# Patient Record
Sex: Male | Born: 1980 | Race: White | Hispanic: No | Marital: Single | Smoking: Current every day smoker
Health system: Southern US, Community
[De-identification: ages and names within clinical notes are randomized; demographics above are authoritative.]

## PROBLEM LIST (undated history)

## (undated) DIAGNOSIS — F419 Anxiety disorder, unspecified: Secondary | ICD-10-CM

## (undated) HISTORY — DX: Anxiety disorder, unspecified: F41.9

---

## 2013-02-20 ENCOUNTER — Emergency Department: Payer: Self-pay | Admitting: Unknown Physician Specialty

## 2013-02-20 LAB — CBC
HCT: 46.9 % (ref 40.0–52.0)
HGB: 15.8 g/dL (ref 13.0–18.0)
MCH: 31.6 pg (ref 26.0–34.0)
MCHC: 33.8 g/dL (ref 32.0–36.0)
MCV: 93 fL (ref 80–100)
RBC: 5.01 10*6/uL (ref 4.40–5.90)
WBC: 12.8 10*3/uL — ABNORMAL HIGH (ref 3.8–10.6)

## 2013-02-20 LAB — COMPREHENSIVE METABOLIC PANEL
Albumin: 4.1 g/dL (ref 3.4–5.0)
Alkaline Phosphatase: 112 U/L (ref 50–136)
BUN: 12 mg/dL (ref 7–18)
Bilirubin,Total: 0.3 mg/dL (ref 0.2–1.0)
Calcium, Total: 9.1 mg/dL (ref 8.5–10.1)
Co2: 29 mmol/L (ref 21–32)
EGFR (African American): >60
Osmolality: 269 (ref 275–301)
Potassium: 4.4 mmol/L (ref 3.5–5.1)
SGOT(AST): 40 U/L — ABNORMAL HIGH (ref 15–37)
SGPT (ALT): 64 U/L (ref 12–78)
Sodium: 135 mmol/L — ABNORMAL LOW (ref 136–145)

## 2015-02-05 ENCOUNTER — Ambulatory Visit (INDEPENDENT_AMBULATORY_CARE_PROVIDER_SITE_OTHER): Payer: BLUE CROSS/BLUE SHIELD | Admitting: Internal Medicine

## 2015-02-05 VITALS — BP 106/70 | HR 110 | Temp 97.8°F | Resp 19 | Ht 70.0 in | Wt 179.6 lb

## 2015-02-05 DIAGNOSIS — Z720 Tobacco use: Secondary | ICD-10-CM

## 2015-02-05 DIAGNOSIS — J0100 Acute maxillary sinusitis, unspecified: Secondary | ICD-10-CM

## 2015-02-05 DIAGNOSIS — G4700 Insomnia, unspecified: Secondary | ICD-10-CM | POA: Insufficient documentation

## 2015-02-05 DIAGNOSIS — F17200 Nicotine dependence, unspecified, uncomplicated: Secondary | ICD-10-CM | POA: Insufficient documentation

## 2015-02-05 MED ORDER — AMOXICILLIN 875 MG PO TABS: 875.0000 mg | ORAL_TABLET | Freq: Two times a day (BID) | ORAL | Status: DC

## 2015-02-05 NOTE — Progress Notes (Signed)
   Subjective:    Patient ID: Earney NavyPaul A Strawderman, male    DOB: Nov 21, 1981, 34 y.o.   MRN: [id number] This chart was scribed for Ellamae Siaobert Shan Padgett, MD by Jolene Provostobert Halas, Medical Scribe. This patient was seen in Room 1 and the patient's care was started a 12:25 PM.  Chief Complaint  Patient presents with  . Sore Throat    x 1 week  . Nasal Congestion    x 1 week  . Chest congestion    x 1 week  . Cough    Productive, 1 week  . Fatigue    x 1 week    HPI HPI Comments: Earney Navyaul A Bradham is a 34 y.o. male who [facility] complaining of sore throat with associated fatigue and congestion. Pt denies fever, chills, and sleep disturbance. Pt states he takes a prescription sleep aid for sleep and is not able to tell if his sx are disturbing his sleep. Pt states that he does not have pain in his throat with eating. Pt smokes.  Pt also endorses jaw pain. Pt has NKDA.   Review of Systems  Constitutional: Positive for fatigue. Negative for fever and chills.  HENT: Positive for congestion, postnasal drip and sinus pressure.   Respiratory: Positive for cough.   Musculoskeletal: Negative for myalgias.       Objective:   Physical Exam  Constitutional: He is oriented to person, place, and time. He appears well-developed and well-nourished. No distress.  HENT:  Head: Normocephalic and atraumatic.  TMs clear. Nares with purulent drainage. TMJ on left is TTP and sub luxes with opening.  Eyes: Pupils are equal, round, and reactive to light.  Neck: Neck supple.  No nodes.  Cardiovascular: Normal rate.   Pulmonary/Chest: Effort normal and breath sounds normal. No respiratory distress. He has no wheezes.  Musculoskeletal: Normal range of motion.  Lymphadenopathy:    He has no cervical adenopathy.  Neurological: He is alert and oriented to person, place, and time. Coordination normal.  Skin: Skin is warm and dry. He is not diaphoretic.  Psychiatric: He has a normal mood and affect. His behavior  is normal.  Nursing note and vitals reviewed.      Assessment & Plan:  Acute maxillary sinusitis, recurrence not specified  Smoker  Insomnia  Meds ordered this encounter  Medications  . amoxicillin (AMOXIL) 875 MG tablet    Sig: Take 1 tablet (875 mg total) by mouth 2 (two) times daily.    Dispense:  20 tablet    Refill:  0  Pseudoephedrine  He is not about quit smoking    I have completed the patient encounter in its entirety as documented by the scribe, with editing by me where necessary. Sandy Haye P. Merla Richesoolittle, M.D.

## 2016-01-14 ENCOUNTER — Ambulatory Visit (INDEPENDENT_AMBULATORY_CARE_PROVIDER_SITE_OTHER): Payer: BLUE CROSS/BLUE SHIELD

## 2016-01-14 ENCOUNTER — Ambulatory Visit (INDEPENDENT_AMBULATORY_CARE_PROVIDER_SITE_OTHER): Payer: BLUE CROSS/BLUE SHIELD | Admitting: Physician Assistant

## 2016-01-14 VITALS — BP 120/80 | HR 92 | Temp 98.3°F | Resp 16 | Ht 69.0 in | Wt 180.4 lb

## 2016-01-14 DIAGNOSIS — R05 Cough: Secondary | ICD-10-CM | POA: Diagnosis not present

## 2016-01-14 DIAGNOSIS — R062 Wheezing: Secondary | ICD-10-CM | POA: Diagnosis not present

## 2016-01-14 DIAGNOSIS — R059 Cough, unspecified: Secondary | ICD-10-CM

## 2016-01-14 LAB — POCT CBC
Granulocyte percent: 75.8 %G (ref 37–80)
HCT, POC: 46.4 % (ref 43.5–53.7)
Hemoglobin: 16.2 g/dL (ref 14.1–18.1)
Lymph, poc: 2.3 (ref 0.6–3.4)
MCH: 29.7 pg (ref 27–31.2)
MCHC: 34.8 g/dL (ref 31.8–35.4)
MCV: 85.3 fL (ref 80–97)
MID (CBC): 0.5 (ref 0–0.9)
MPV: 7.4 fL (ref 0–99.8)
POC Granulocyte: 8.9 — AB (ref 2–6.9)
POC LYMPH %: 19.7 % (ref 10–50)
POC MID %: 4.5 % (ref 0–12)
Platelet Count, POC: 298 10*3/uL (ref 142–424)
RBC: 5.44 M/uL (ref 4.69–6.13)
RDW, POC: 14.7 %
WBC: 11.7 10*3/uL — AB (ref 4.6–10.2)

## 2016-01-14 MED ORDER — AMOXICILLIN-POT CLAVULANATE 875-125 MG PO TABS: 1.0000 | ORAL_TABLET | Freq: Two times a day (BID) | ORAL | Status: AC

## 2016-01-14 MED ORDER — ALBUTEROL SULFATE (2.5 MG/3ML) 0.083% IN NEBU
2.5000 mg | INHALATION_SOLUTION | Freq: Once | RESPIRATORY_TRACT | Status: AC
  Administered 2016-01-14: 2.5 mg via RESPIRATORY_TRACT

## 2016-01-14 MED ORDER — IPRATROPIUM BROMIDE 0.02 % IN SOLN
0.5000 mg | Freq: Once | RESPIRATORY_TRACT | Status: AC
  Administered 2016-01-14: 0.5 mg via RESPIRATORY_TRACT

## 2016-01-14 MED ORDER — PREDNISONE 20 MG PO TABS: ORAL_TABLET | ORAL | Status: AC

## 2016-01-14 NOTE — Progress Notes (Signed)
01/14/2016 2:09 PM   DOB: January 22, 1981 / MRN: [id number]  SUBJECTIVE:  Gregg Keller is a 35 y.o. male presenting for sore throat that started 3-4 days ago. Has been taking dayquil with some relief.  He has had low grade fever.  Associates nasal congestion and drainage, chest congestion.  He denies a history of asthma but has used bronchodilators in the past with good relief.  He is a current smoker with 18 pack year history. He is not ready to quit.    He has No Known Allergies.   He  has a past medical history of Anxiety.    He  reports that he has been smoking.  He does not have any smokeless tobacco history on file. He  has no sexual activity history on file. The patient  has no past surgical history on file.  His family history includes Cancer in his mother; Heart disease in his father; Hypertension in his brother, father, maternal grandfather, and paternal grandmother.  Review of Systems  Constitutional: Positive for chills and malaise/fatigue. Negative for fever.  Respiratory: Positive for cough and shortness of breath.   Cardiovascular: Negative for chest pain and leg swelling.  Skin: Negative for rash.  Neurological: Positive for weakness. Negative for dizziness and headaches.  Psychiatric/Behavioral: Negative for depression.    Problem list and medications reviewed and updated by myself where necessary, and exist elsewhere in the encounter.   OBJECTIVE:  BP 120/80 mmHg  Pulse 92  Temp(Src) 98.3 F (36.8 C)  Resp 16  Ht  (1.753 m)  Wt 180 lb 6.4 oz (81.829 kg)  BMI 26.63 kg/m2  SpO2 98%  Physical Exam  Constitutional: He is oriented to person, place, and time. He appears well-developed. He does not appear ill.  Eyes: Conjunctivae and EOM are normal. Pupils are equal, round, and reactive to light.  Cardiovascular: Normal rate.   Pulmonary/Chest: Effort normal. No respiratory distress. He has wheezes. He has no rales. He exhibits no tenderness.  Abdominal: He  exhibits no distension.  Musculoskeletal: Normal range of motion.  Neurological: He is alert and oriented to person, place, and time. No cranial nerve deficit. Coordination normal.  Skin: Skin is warm and dry. He is not diaphoretic.  Psychiatric: He has a normal mood and affect.  Nursing note and vitals reviewed.   Results for orders placed or performed in visit on 01/14/16 (from the past 72 hour(s))  POCT CBC     Status: Abnormal   Collection Time: 01/14/16  1:50 PM  Result Value Ref Range   WBC 11.7 (A) 4.6 - 10.2 K/uL   Lymph, poc 2.3 0.6 - 3.4   POC LYMPH PERCENT 19.7 10 - 50 %L   MID (cbc) 0.5 0 - 0.9   POC MID % 4.5 0 - 12 %M   POC Granulocyte 8.9 (A) 2 - 6.9   Granulocyte percent 75.8 37 - 80 %G   RBC 5.44 4.69 - 6.13 M/uL   Hemoglobin 16.2 14.1 - 18.1 g/dL   HCT, POC 78.2 95.6 - 53.7 %   MCV 85.3 80 - 97 fL   MCH, POC 29.7 27 - 31.2 pg   MCHC 34.8 31.8 - 35.4 g/dL   RDW, POC 21.3 %   Platelet Count, POC 298 142 - 424 K/uL   MPV 7.4 0 - 99.8 fL    Dg Chest 2 View  01/14/2016  CLINICAL DATA:  Cough, wheezing, chest congestion, 20 year history of smoking EXAM: CHEST  2 VIEW COMPARISON:  None in PACs FINDINGS: The lungs are adequately inflated. There is no focal infiltrate. There is no pleural effusion. The heart and pulmonary vascularity are normal. The mediastinum is normal in width. The bony thorax is unremarkable. IMPRESSION: There is no active cardiopulmonary disease. Electronically Signed   By: David  [place] M.D.   On: 01/14/2016 13:48    ASSESSMENT AND PLAN  Huy was seen today for sore throat, nasal congestion, fatigue and cough.  Diagnoses and all orders for this visit:  Cough; Given granulocytosis will treat him for a walking pneumonia.  Rads clear.  Advised patient the he needs to stop smoking as this could also be a COPD flare.   -     POCT CBC -     DG Chest 2 View; Future  Wheezing -     albuterol (PROVENTIL) (2.5 MG/3ML) 0.083% nebulizer solution 2.5 mg;  Take 3 mLs (2.5 mg total) by nebulization once. -     ipratropium (ATROVENT) nebulizer solution 0.5 mg; Take 2.5 mLs (0.5 mg total) by nebulization once. -     predniSONE (DELTASONE) 20 MG tablet; Take 3 in the morning for 3 days, then 2 in the morning for 3 days, and then 1 in the morning for 3 days. -     amoxicillin-clavulanate (AUGMENTIN) 875-125 MG tablet; Take 1 tablet by mouth 2 (two) times daily.    The patient was advised to call or return to clinic if he does not see an improvement in symptoms or to seek the care of the closest emergency department if he worsens with the above plan.   Deliah Boston, MHS, PA-C [facility] 01/14/2016 2:09 PM

## 2016-01-18 ENCOUNTER — Ambulatory Visit (INDEPENDENT_AMBULATORY_CARE_PROVIDER_SITE_OTHER): Payer: BLUE CROSS/BLUE SHIELD

## 2016-01-18 ENCOUNTER — Ambulatory Visit (INDEPENDENT_AMBULATORY_CARE_PROVIDER_SITE_OTHER): Payer: BLUE CROSS/BLUE SHIELD | Admitting: Family Medicine

## 2016-01-18 VITALS — BP 118/82 | HR 116 | Temp 98.8°F | Resp 18 | Ht 68.0 in | Wt 182.0 lb

## 2016-01-18 DIAGNOSIS — J209 Acute bronchitis, unspecified: Secondary | ICD-10-CM

## 2016-01-18 DIAGNOSIS — R05 Cough: Secondary | ICD-10-CM | POA: Diagnosis not present

## 2016-01-18 DIAGNOSIS — R42 Dizziness and giddiness: Secondary | ICD-10-CM

## 2016-01-18 DIAGNOSIS — R110 Nausea: Secondary | ICD-10-CM

## 2016-01-18 DIAGNOSIS — R059 Cough, unspecified: Secondary | ICD-10-CM

## 2016-01-18 LAB — POCT CBC
Granulocyte percent: 90.5 %G — AB (ref 37–80)
HCT, POC: 46.8 % (ref 43.5–53.7)
Hemoglobin: 16.2 g/dL (ref 14.1–18.1)
Lymph, poc: 1 (ref 0.6–3.4)
MCH: 29.8 pg (ref 27–31.2)
MCHC: 34.6 g/dL (ref 31.8–35.4)
MCV: 86.1 fL (ref 80–97)
MID (CBC): 0.5 (ref 0–0.9)
MPV: 6.8 fL (ref 0–99.8)
POC Granulocyte: 13.8 — AB (ref 2–6.9)
POC LYMPH %: 6.5 % — AB (ref 10–50)
POC MID %: 3 % (ref 0–12)
Platelet Count, POC: 262 10*3/uL (ref 142–424)
RBC: 5.43 M/uL (ref 4.69–6.13)
RDW, POC: 15 %
WBC: 15.3 10*3/uL — AB (ref 4.6–10.2)

## 2016-01-18 MED ORDER — AZITHROMYCIN 250 MG PO TABS: ORAL_TABLET | ORAL | Status: AC

## 2016-01-18 NOTE — Progress Notes (Signed)
Patient ID: Gregg Keller, male    DOB: 12-17-1980  Age: 35 y.o. MRN: [id number]  Chief Complaint  Patient presents with  . Dizziness    dx with pneumonia, not feeling better  . Fatigue    Subjective:   Patient was here a few days ago with a respiratory tract infection has been feeling very dizzy. He was diagnosed with a pneumonia but is not feeling better. He is fatigued. The radiologist actually did not: Pneumonia. He has had to go home from work last 2 days early because he is feeling so dizzy and bad.  Current allergies, medications, problem list, past/family and social histories reviewed.  Objective:  BP 118/82 mmHg  Pulse 116  Temp(Src) 98.8 F (37.1 C)  Resp 18  Ht  (1.727 m)  Wt 182 lb (82.555 kg)  BMI 27.68 kg/m2  Throat clear. Neck supple without nodes. TMs normal. Chest has some mild rhonchi and wheezing on the right. Heart regular without murmurs.  WBC was 11,700 a few days ago.  Assessment & Plan:   Assessment: 1. Dizziness and giddiness   2. Nausea without vomiting   3. Cough   4. Acute bronchitis, unspecified organism       Plan: I'm not sure exactly why he is gotten worse, but the elevated white blood count therefore I'm stopping the amoxicillin which is upsetting his stomach and trying some azithromycin on him. He is to come in if he is getting worse rather than better.  Orders Placed This Encounter  Procedures  . DG Chest 2 View    Order Specific Question:  Reason for Exam (SYMPTOM  OR DIAGNOSIS REQUIRED)    Answer:  right congestion, weakness    Order Specific Question:  Preferred imaging location?    Answer:  External  . Comprehensive metabolic panel  . POCT CBC   Results for orders placed or performed in visit on 01/18/16  POCT CBC  Result Value Ref Range   WBC 15.3 (A) 4.6 - 10.2 K/uL   Lymph, poc 1.0 0.6 - 3.4   POC LYMPH PERCENT 6.5 (A) 10 - 50 %L   MID (cbc) 0.5 0 - 0.9   POC MID % 3.0 0 - 12 %M   POC Granulocyte 13.8 (A) 2  - 6.9   Granulocyte percent 90.5 (A) 37 - 80 %G   RBC 5.43 4.69 - 6.13 M/uL   Hemoglobin 16.2 14.1 - 18.1 g/dL   HCT, POC 40.9 81.1 - 53.7 %   MCV 86.1 80 - 97 fL   MCH, POC 29.8 27 - 31.2 pg   MCHC 34.6 31.8 - 35.4 g/dL   RDW, POC 91.4 %   Platelet Count, POC 262 142 - 424 K/uL   MPV 6.8 0 - 99.8 fL   Chest x-ray normal.  Meds ordered this encounter  Medications  . azithromycin (ZITHROMAX) 250 MG tablet    Sig: Take 2 pills initially, then 1 daily for 4 days    Dispense:  6 tablet    Refill:  0         Patient Instructions  Take azithromycin 2 pills initially, then 1 daily.  Stay off work  Drink lots of fluids  If not feeling remarkably better over the next 2-3 days please return for a recheck again, sooner if worse  Do not take your citalopram today, Thursday, or Saturday. The azithromycin can raise the level of this medicine in your system.   Because you received an  x-ray today, you will receive an invoice from [facility]. Please contact [facility] at [phone] with questions or concerns regarding your invoice. Our billing staff will not be able to assist you with those questions.      No Follow-up on file.   Audry Kauzlarich, MD 01/18/2016

## 2016-01-18 NOTE — Patient Instructions (Addendum)
Take azithromycin 2 pills initially, then 1 daily.  Stay off work  Drink lots of fluids  If not feeling remarkably better over the next 2-3 days please return for a recheck again, sooner if worse  Do not take your citalopram today, Thursday, or Saturday. The azithromycin can raise the level of this medicine in your system.   Because you received an x-ray today, you will receive an invoice from [facility]. Please contact [facility] at [phone] with questions or concerns regarding your invoice. Our billing staff will not be able to assist you with those questions.

## 2016-01-19 LAB — COMPREHENSIVE METABOLIC PANEL
ALT: 18 U/L (ref 9–46)
AST: 15 U/L (ref 10–40)
Albumin: 4.3 g/dL (ref 3.6–5.1)
Alkaline Phosphatase: 65 U/L (ref 40–115)
BUN: 22 mg/dL (ref 7–25)
CHLORIDE: 100 mmol/L (ref 98–110)
CO2: 28 mmol/L (ref 20–31)
CREATININE: 1.04 mg/dL (ref 0.60–1.35)
Calcium: 9.3 mg/dL (ref 8.6–10.3)
GLUCOSE: 82 mg/dL (ref 65–99)
POTASSIUM: 5 mmol/L (ref 3.5–5.3)
Sodium: 138 mmol/L (ref 135–146)
Total Bilirubin: 0.6 mg/dL (ref 0.2–1.2)
Total Protein: 6.6 g/dL (ref 6.1–8.1)

## 2016-01-26 ENCOUNTER — Encounter: Payer: Self-pay | Admitting: Physician Assistant

## 2016-01-29 ENCOUNTER — Telehealth: Payer: Self-pay | Admitting: Family Medicine

## 2016-01-29 NOTE — Telephone Encounter (Signed)
Patient called stating that Gregg Keller told him  that if he wanted to stop smoking to give [place] a call and he will send out and RX of Chantex  for him please send  to [facility] on [address] let pt know when you send it

## 2016-02-01 ENCOUNTER — Other Ambulatory Visit: Payer: Self-pay | Admitting: Physician Assistant

## 2016-02-01 MED ORDER — VARENICLINE TARTRATE 1 MG PO TABS: 1.0000 mg | ORAL_TABLET | Freq: Two times a day (BID) | ORAL | Status: AC

## 2016-02-01 MED ORDER — VARENICLINE TARTRATE 0.5 MG X 11 & 1 MG X 42 PO MISC: ORAL | Status: AC

## 2016-02-01 NOTE — Telephone Encounter (Signed)
This is all done.  Please let him know. Deliah Boston, MS, PA-C 10:10 AM, 02/01/2016

## 2016-06-27 IMAGING — CR DG CHEST 2V
2 series · 2 of 2 positions shown · non-contrast
Comparison: Chest radiograph 01/14/2016.

CLINICAL DATA: Congestion and weakness.

EXAM:
CHEST  2 VIEW

[PA]
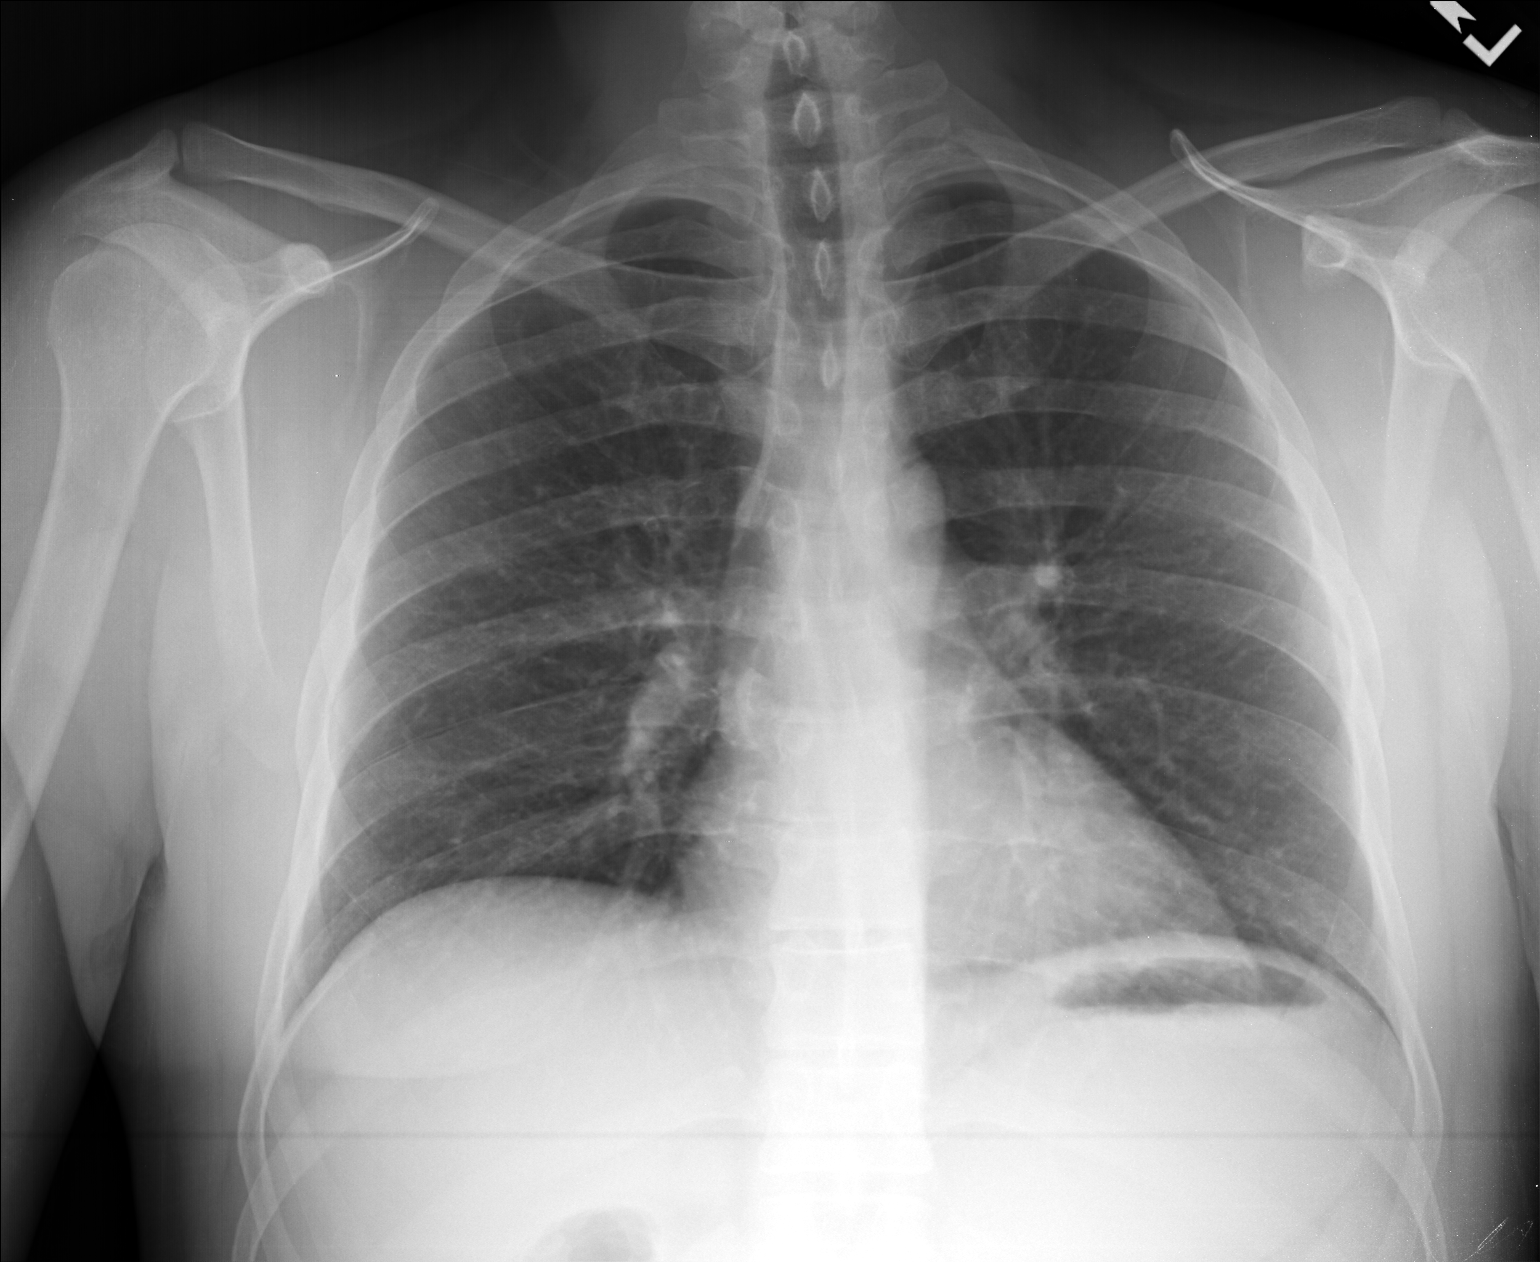

[lateral]
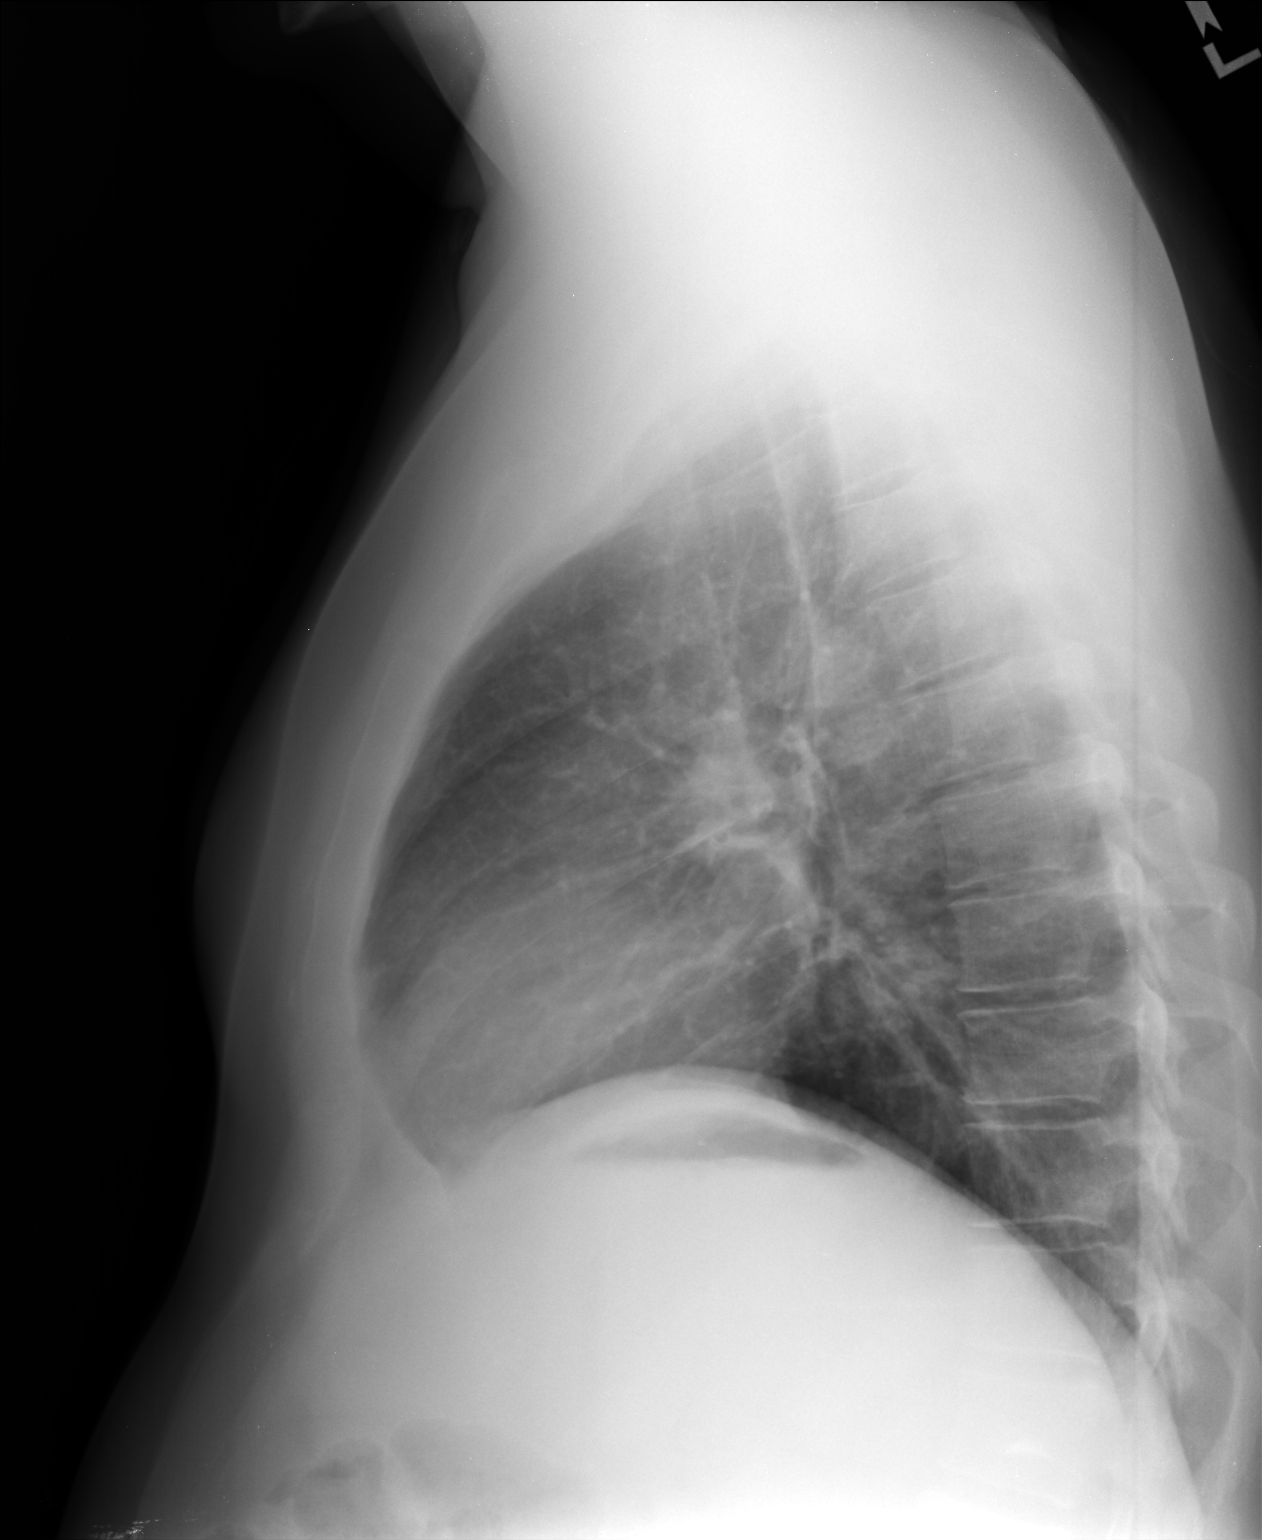

[2 of 2 positions shown; findings below may reference images not displayed]

FINDINGS: Normal cardiac and mediastinal contours. No consolidative pulmonary
opacities. No pleural effusion or pneumothorax. Regional skeleton is
unremarkable.
IMPRESSION: No acute cardiopulmonary process.

## 2020-04-05 ENCOUNTER — Other Ambulatory Visit: Payer: Self-pay | Admitting: Gastroenterology

## 2020-04-05 DIAGNOSIS — R110 Nausea: Secondary | ICD-10-CM
